# Patient Record
Sex: Female | Born: 1980 | Hispanic: Yes | Marital: Married | Smoking: Never smoker
Health system: Southern US, Community
[De-identification: ages and names within clinical notes are randomized; demographics above are authoritative.]

## PROBLEM LIST (undated history)

## (undated) DIAGNOSIS — Z789 Other specified health status: Secondary | ICD-10-CM

## (undated) HISTORY — PX: NO PAST SURGERIES: SHX2092

---

## 2002-11-19 ENCOUNTER — Emergency Department (HOSPITAL_COMMUNITY): Admission: EM | Admit: 2002-11-19 | Discharge: 2002-11-19 | Payer: Self-pay

## 2003-03-20 ENCOUNTER — Ambulatory Visit (HOSPITAL_COMMUNITY): Admission: RE | Admit: 2003-03-20 | Discharge: 2003-03-20 | Payer: Self-pay | Admitting: Obstetrics

## 2003-06-24 ENCOUNTER — Encounter: Admission: RE | Admit: 2003-06-24 | Discharge: 2003-06-24 | Payer: Self-pay | Admitting: Infectious Diseases

## 2003-06-27 ENCOUNTER — Encounter: Admission: RE | Admit: 2003-06-27 | Discharge: 2003-06-27 | Payer: Self-pay | Admitting: Infectious Diseases

## 2003-07-10 ENCOUNTER — Inpatient Hospital Stay (HOSPITAL_COMMUNITY): Admission: AD | Admit: 2003-07-10 | Discharge: 2003-07-12 | Payer: Self-pay | Admitting: Obstetrics

## 2006-08-17 ENCOUNTER — Ambulatory Visit (HOSPITAL_COMMUNITY): Admission: RE | Admit: 2006-08-17 | Discharge: 2006-08-17 | Payer: Self-pay | Admitting: Obstetrics & Gynecology

## 2006-10-31 ENCOUNTER — Ambulatory Visit (HOSPITAL_COMMUNITY): Admission: RE | Admit: 2006-10-31 | Discharge: 2006-10-31 | Payer: Self-pay | Admitting: Obstetrics & Gynecology

## 2006-10-31 IMAGING — US US OB COMP +14 WK
1 series · 14 of 28 positions shown · non-contrast
Comparison: none

OBSTETRICAL ULTRASOUND:

 This ultrasound exam was performed in the [HOSPITAL] Ultrasound Department.  The OB US report was generated in the AS system, and faxed to the ordering physician.  This report is also available in [REDACTED] PACS.

[Series 1: us ob comp +14 wk · 0.30mm/px · 14 of 74 slices shown]
[im 3/74]
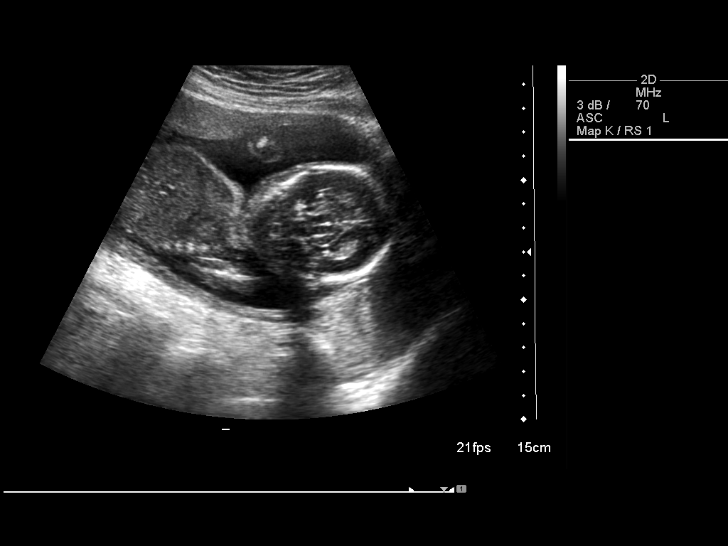
[im 9/74]
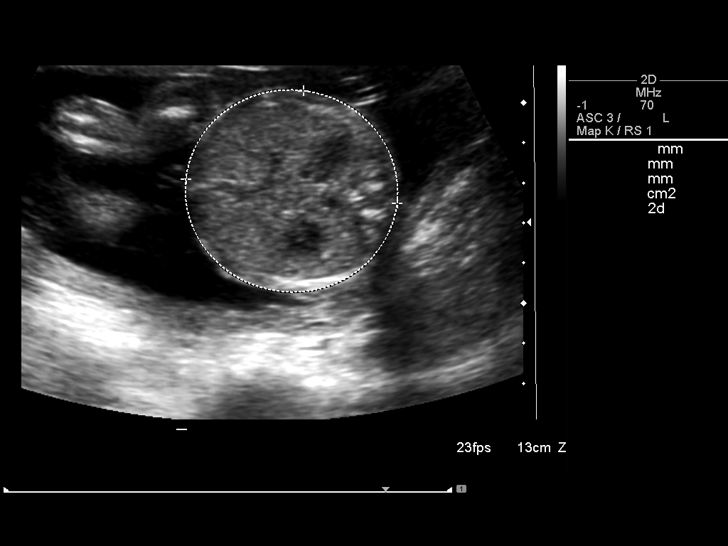
[im 14/74]
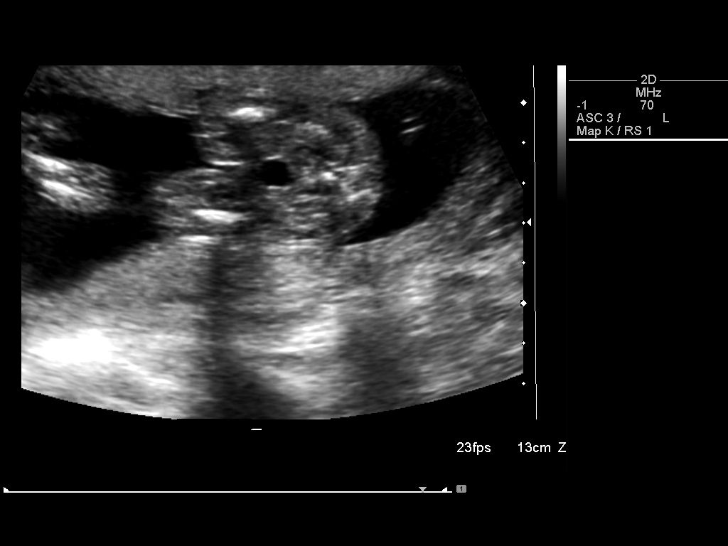
[im 19/74]
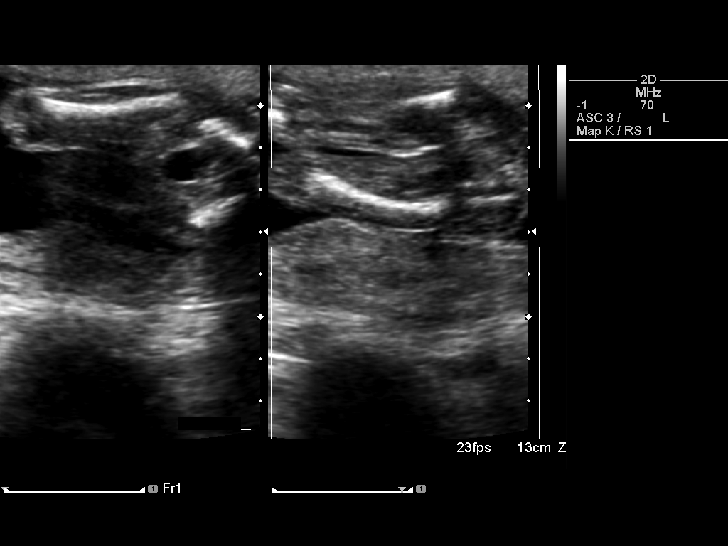
[im 25/74]
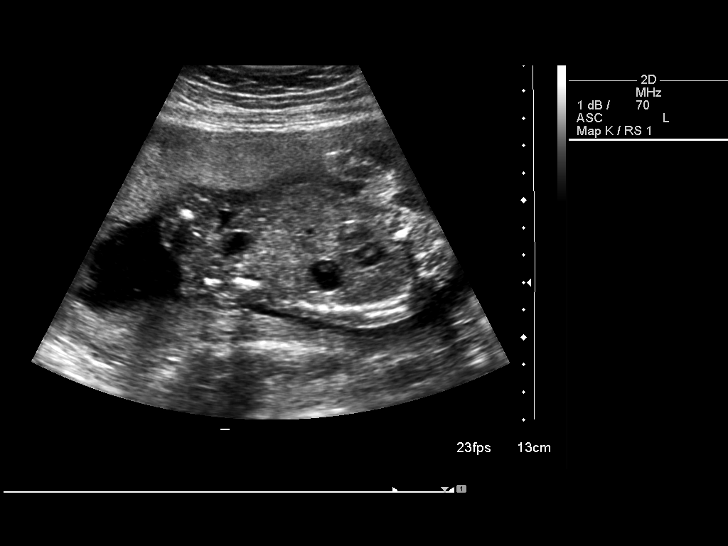
[im 30/74]
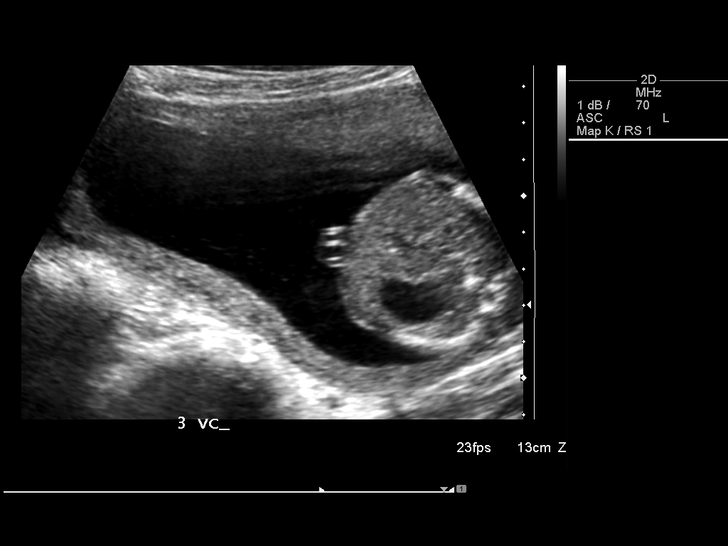
[im 36/74]
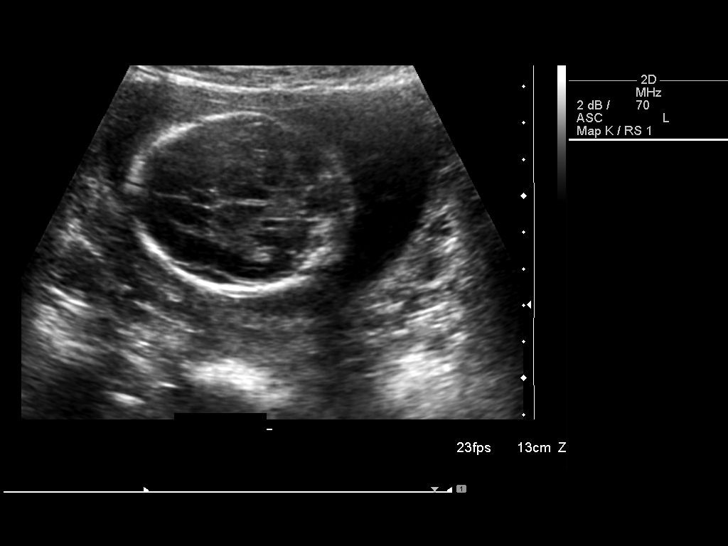
[im 41/74]
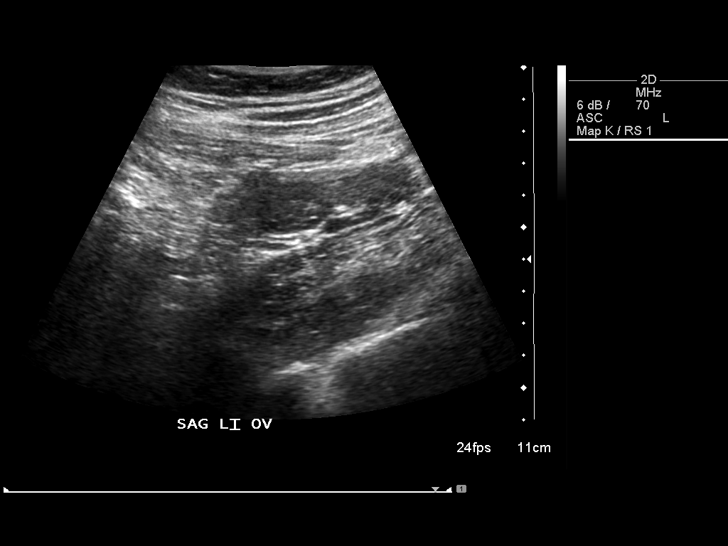
[im 46/74]
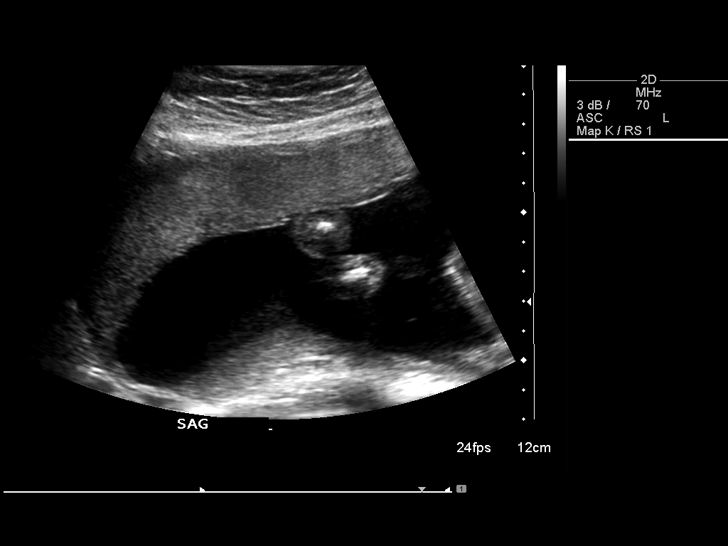
[im 52/74]
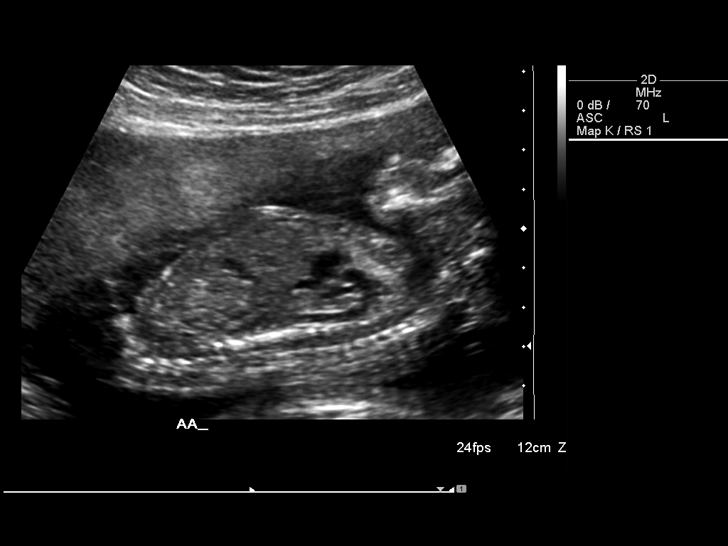
[im 57/74]
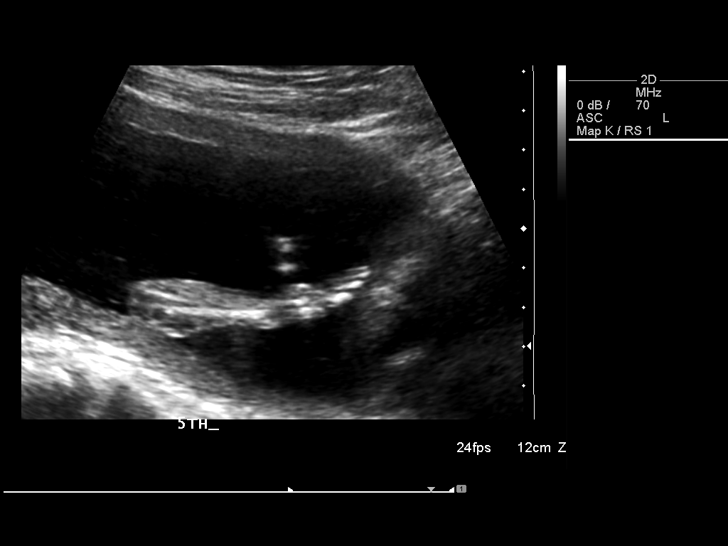
[im 63/74]
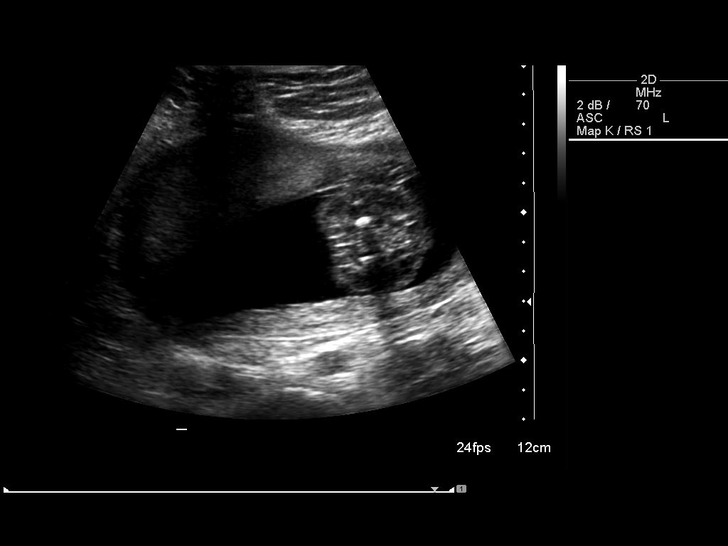
[im 68/74]
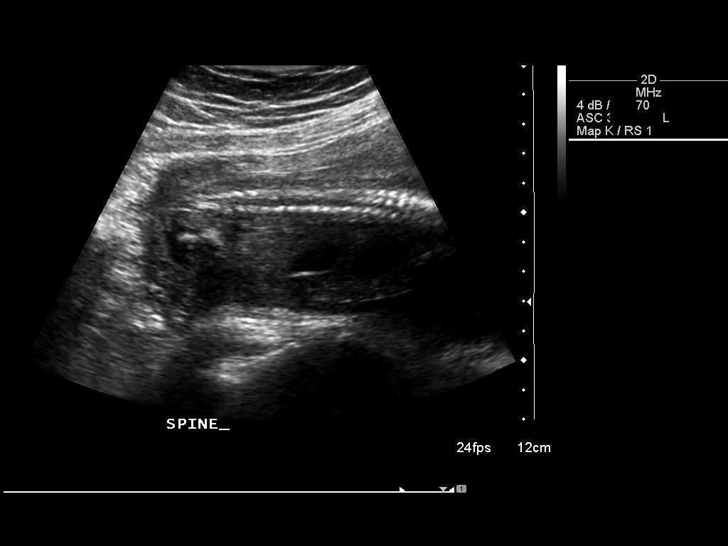
[im 74/74]
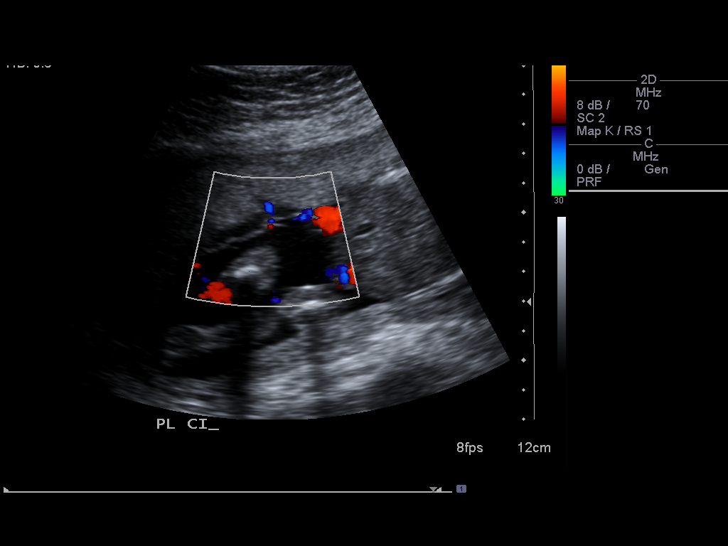

[14 of 28 positions shown; findings below may reference images not displayed]

IMPRESSION: See AS Obstetric US report.

## 2007-03-10 ENCOUNTER — Inpatient Hospital Stay (HOSPITAL_COMMUNITY): Admission: AD | Admit: 2007-03-10 | Discharge: 2007-03-13 | Payer: Self-pay | Admitting: Obstetrics

## 2010-02-28 ENCOUNTER — Inpatient Hospital Stay (HOSPITAL_COMMUNITY)
Admission: AD | Admit: 2010-02-28 | Discharge: 2010-02-28 | Disposition: A | Discharge status: Home / Self Care | Payer: Self-pay | Source: Ambulatory Visit | Attending: Obstetrics | Admitting: Obstetrics

## 2010-02-28 ENCOUNTER — Inpatient Hospital Stay (HOSPITAL_COMMUNITY): Payer: Self-pay

## 2010-02-28 DIAGNOSIS — O021 Missed abortion: Secondary | ICD-10-CM

## 2010-10-02 LAB — CBC
HCT: 39.3
Hemoglobin: 13.6
MCHC: 34.5
MCV: 94
Platelets: 172
RBC: 4.18
RDW: 14.8
WBC: 9.3

## 2010-10-02 LAB — RPR: RPR Ser Ql: NONREACTIVE

## 2010-10-05 LAB — CBC
HCT: 34.5 — ABNORMAL LOW
Hemoglobin: 12.1
MCHC: 34.9
MCV: 92.9
Platelets: 136 — ABNORMAL LOW
RBC: 3.72 — ABNORMAL LOW
RDW: 14.8
WBC: 8.8

## 2011-01-12 NOTE — L&D Delivery Note (Signed)
Delivery Note At 12:36 PM a viable female was delivered via Vaginal, Spontaneous Delivery (Presentation: ;  ).  APGAR: 9, 9; weight .   Placenta status: , .  Cord:  with the following complications: None.  Cord pH: not done  Anesthesia: None  Episiotomy:  Lacerations:  Suture Repair: 2.0 Est. Blood Loss (mL):   Mom to postpartum.  Baby to nursery-stable.  Bonnie King A 10/02/2011, 12:50 PM

## 2011-04-23 LAB — OB RESULTS CONSOLE HEPATITIS B SURFACE ANTIGEN: Hepatitis B Surface Ag: NEGATIVE

## 2011-04-23 LAB — OB RESULTS CONSOLE ABO/RH: RH Type: POSITIVE

## 2011-04-23 LAB — OB RESULTS CONSOLE RPR: RPR: NONREACTIVE

## 2011-08-31 LAB — OB RESULTS CONSOLE GBS: GBS: NEGATIVE

## 2011-10-02 ENCOUNTER — Encounter (HOSPITAL_COMMUNITY): Payer: Self-pay | Admitting: *Deleted

## 2011-10-02 ENCOUNTER — Inpatient Hospital Stay (HOSPITAL_COMMUNITY)
Admission: AD | Admit: 2011-10-02 | Discharge: 2011-10-02 | Disposition: A | Discharge status: Home / Self Care | Payer: Medicaid Other | Source: Ambulatory Visit | Attending: Obstetrics | Admitting: Obstetrics

## 2011-10-02 ENCOUNTER — Encounter (HOSPITAL_COMMUNITY): Payer: Self-pay

## 2011-10-02 ENCOUNTER — Inpatient Hospital Stay (HOSPITAL_COMMUNITY)
Admission: AD | Admit: 2011-10-02 | Discharge: 2011-10-04 | DRG: 775 | Disposition: A | Discharge status: Home / Self Care | Payer: Medicaid Other | Source: Ambulatory Visit | Attending: Obstetrics | Admitting: Obstetrics

## 2011-10-02 DIAGNOSIS — O479 False labor, unspecified: Secondary | ICD-10-CM | POA: Insufficient documentation

## 2011-10-02 LAB — CBC
Hemoglobin: 13.5 g/dL (ref 12.0–15.0)
MCH: 30.8 pg (ref 26.0–34.0)
MCHC: 33.8 g/dL (ref 30.0–36.0)

## 2011-10-02 LAB — RPR: RPR Ser Ql: NONREACTIVE

## 2011-10-02 MED ORDER — DIPHENHYDRAMINE HCL 25 MG PO CAPS: 25.0000 mg | ORAL_CAPSULE | Freq: Four times a day (QID) | ORAL | Status: DC | PRN

## 2011-10-02 MED ORDER — LANOLIN HYDROUS EX OINT: TOPICAL_OINTMENT | CUTANEOUS | Status: DC | PRN

## 2011-10-02 MED ORDER — SIMETHICONE 80 MG PO CHEW: 80.0000 mg | CHEWABLE_TABLET | ORAL | Status: DC | PRN

## 2011-10-02 MED ORDER — ZOLPIDEM TARTRATE 5 MG PO TABS: 5.0000 mg | ORAL_TABLET | Freq: Every evening | ORAL | Status: DC | PRN

## 2011-10-02 MED ORDER — ONDANSETRON HCL 4 MG PO TABS: 4.0000 mg | ORAL_TABLET | ORAL | Status: DC | PRN

## 2011-10-02 MED ORDER — TETANUS-DIPHTH-ACELL PERTUSSIS 5-2.5-18.5 LF-MCG/0.5 IM SUSP
0.5000 mL | Freq: Once | INTRAMUSCULAR | Status: AC
  Administered 2011-10-02: 0.5 mL via INTRAMUSCULAR
  Filled 2011-10-02: qty 0.5

## 2011-10-02 MED ORDER — ONDANSETRON HCL 4 MG/2ML IJ SOLN: 4.0000 mg | Freq: Four times a day (QID) | INTRAMUSCULAR | Status: DC | PRN

## 2011-10-02 MED ORDER — BUTORPHANOL TARTRATE 1 MG/ML IJ SOLN
1.0000 mg | INTRAMUSCULAR | Status: DC | PRN
  Administered 2011-10-02: 1 mg via INTRAVENOUS

## 2011-10-02 MED ORDER — OXYTOCIN BOLUS FROM INFUSION
500.0000 mL | Freq: Once | INTRAVENOUS | Status: DC
  Filled 2011-10-02: qty 500

## 2011-10-02 MED ORDER — LACTATED RINGERS IV SOLN: 500.0000 mL | INTRAVENOUS | Status: DC | PRN

## 2011-10-02 MED ORDER — BUTORPHANOL TARTRATE 1 MG/ML IJ SOLN
INTRAMUSCULAR | Status: AC
  Filled 2011-10-02: qty 1

## 2011-10-02 MED ORDER — ACETAMINOPHEN 325 MG PO TABS: 650.0000 mg | ORAL_TABLET | ORAL | Status: DC | PRN

## 2011-10-02 MED ORDER — DIBUCAINE 1 % RE OINT: 1.0000 "application " | TOPICAL_OINTMENT | RECTAL | Status: DC | PRN

## 2011-10-02 MED ORDER — TETANUS-DIPHTH-ACELL PERTUSSIS 5-2.5-18.5 LF-MCG/0.5 IM SUSP: 0.5000 mL | Freq: Once | INTRAMUSCULAR | Status: DC

## 2011-10-02 MED ORDER — OXYCODONE-ACETAMINOPHEN 5-325 MG PO TABS
1.0000 | ORAL_TABLET | ORAL | Status: DC | PRN
  Filled 2011-10-02: qty 2

## 2011-10-02 MED ORDER — CITRIC ACID-SODIUM CITRATE 334-500 MG/5ML PO SOLN: 30.0000 mL | ORAL | Status: DC | PRN

## 2011-10-02 MED ORDER — LACTATED RINGERS IV SOLN
INTRAVENOUS | Status: DC
  Administered 2011-10-02: 1000 mL via INTRAVENOUS

## 2011-10-02 MED ORDER — IBUPROFEN 600 MG PO TABS
600.0000 mg | ORAL_TABLET | Freq: Four times a day (QID) | ORAL | Status: DC
  Administered 2011-10-02 – 2011-10-04 (×8): 600 mg via ORAL
  Filled 2011-10-02 (×5): qty 1

## 2011-10-02 MED ORDER — IBUPROFEN 600 MG PO TABS
600.0000 mg | ORAL_TABLET | Freq: Four times a day (QID) | ORAL | Status: DC | PRN
  Filled 2011-10-02 (×3): qty 1

## 2011-10-02 MED ORDER — OXYCODONE-ACETAMINOPHEN 5-325 MG PO TABS
1.0000 | ORAL_TABLET | ORAL | Status: DC | PRN
  Administered 2011-10-02 – 2011-10-03 (×2): 1 via ORAL
  Administered 2011-10-04 (×2): 2 via ORAL
  Filled 2011-10-02: qty 2
  Filled 2011-10-02 (×2): qty 1

## 2011-10-02 MED ORDER — BENZOCAINE-MENTHOL 20-0.5 % EX AERO: 1.0000 "application " | INHALATION_SPRAY | CUTANEOUS | Status: DC | PRN

## 2011-10-02 MED ORDER — LIDOCAINE HCL (PF) 1 % IJ SOLN
30.0000 mL | INTRAMUSCULAR | Status: DC | PRN
  Filled 2011-10-02: qty 30

## 2011-10-02 MED ORDER — ONDANSETRON HCL 4 MG/2ML IJ SOLN: 4.0000 mg | INTRAMUSCULAR | Status: DC | PRN

## 2011-10-02 MED ORDER — WITCH HAZEL-GLYCERIN EX PADS: 1.0000 "application " | MEDICATED_PAD | CUTANEOUS | Status: DC | PRN

## 2011-10-02 MED ORDER — FERROUS SULFATE 325 (65 FE) MG PO TABS
325.0000 mg | ORAL_TABLET | Freq: Two times a day (BID) | ORAL | Status: DC
  Administered 2011-10-02 – 2011-10-04 (×4): 325 mg via ORAL
  Filled 2011-10-02 (×4): qty 1

## 2011-10-02 MED ORDER — OXYTOCIN 40 UNITS IN LACTATED RINGERS INFUSION - SIMPLE MED
62.5000 mL/h | Freq: Once | INTRAVENOUS | Status: AC
  Administered 2011-10-02: 62.5 mL/h via INTRAVENOUS
  Filled 2011-10-02: qty 1000

## 2011-10-02 MED ORDER — SENNOSIDES-DOCUSATE SODIUM 8.6-50 MG PO TABS
2.0000 | ORAL_TABLET | Freq: Every day | ORAL | Status: DC
  Administered 2011-10-02 – 2011-10-03 (×2): 2 via ORAL

## 2011-10-02 MED ORDER — PRENATAL MULTIVITAMIN CH
1.0000 | ORAL_TABLET | Freq: Every day | ORAL | Status: DC
  Administered 2011-10-03 – 2011-10-04 (×2): 1 via ORAL
  Filled 2011-10-02 (×2): qty 1

## 2011-10-02 MED ORDER — INFLUENZA VIRUS VACC SPLIT PF IM SUSP
0.5000 mL | INTRAMUSCULAR | Status: AC
  Administered 2011-10-03: 0.5 mL via INTRAMUSCULAR
  Filled 2011-10-02: qty 0.5

## 2011-10-02 NOTE — MAU Note (Signed)
Patient is in for labor eval. She denies any vaginal bleeding or lof. Ctx q100m fpr 4 hours. Reports good fetal movement.

## 2011-10-02 NOTE — MAU Note (Signed)
Pt reports having ctx q3 min more painful. Started yesterday. reports bloody show and good fetal movement.

## 2011-10-02 NOTE — Progress Notes (Signed)
Dr Gaynell Face notified of patient, tracing, ctx pattern, sve result. Order to discharge home.

## 2011-10-02 NOTE — H&P (Signed)
This is Dr. Francoise Ceo dictating the history and physical on  Bonnie King she's a 31 year old gravida 4 para 2012 at 8 weeks and and some days her EDC is 10/04/2011 admitted in labor 7 cm 100% vertex 0 station she is now 9 cm 100% vertex minus at 0 station amniotomy performed the fluid obtained no history of fluid leakage the GBS is negative Past medical history negative Past surgical history negative Social history negative System review negative Physical exam well-developed female in labor HEENT negative Breasts negative Heart regular rhythm no murmurs no gallops Lungs clear to P&A Abdomen term Pelvic as described above Extremities negative

## 2011-10-03 LAB — CBC
HCT: 36.7 % (ref 36.0–46.0)
Hemoglobin: 12.1 g/dL (ref 12.0–15.0)
RBC: 3.97 MIL/uL (ref 3.87–5.11)

## 2011-10-03 LAB — ABO/RH: ABO/RH(D): A POS

## 2011-10-03 NOTE — Progress Notes (Signed)
Patient ID: Bonnie King, female   DOB: 1980-03-14, 31 y.o.   MRN: [id number] Postpartum no daily 1 Vital signs normal Fundus firm Lochia moderate Legs negative no well

## 2011-10-04 NOTE — Progress Notes (Signed)
UR chart review completed.  

## 2011-10-04 NOTE — Discharge Summary (Signed)
Obstetric Discharge Summary Reason for Admission: induction of labor Prenatal Procedures: none Intrapartum Procedures: spontaneous vaginal delivery Postpartum Procedures: none Complications-Operative and Postpartum: none Hemoglobin  Date Value Range Status  10/03/2011 12.1  12.0 - 15.0 g/dL Final     HCT  Date Value Range Status  10/03/2011 36.7  36.0 - 46.0 % Final    Physical Exam:  General: alert Lochia: appropriate Uterine Fundus: firm Incision: healing well DVT Evaluation: No evidence of DVT seen on physical exam.  Discharge Diagnoses: Term Pregnancy-delivered  Discharge Information: Date: 10/04/2011 Activity: pelvic rest Diet: routine Medications: Percocet Condition: stable Instructions: refer to practice specific booklet Discharge to: home Follow-up Information    Call in 6 weeks to follow up.         Newborn Data: Live born female  Birth Weight: 8 lb 1.3 oz (3665 g) APGAR: 9, 9  Home with mother.  Kenechukwu Eckstein A 10/04/2011, 5:55 AM

## 2013-11-12 ENCOUNTER — Encounter (HOSPITAL_COMMUNITY): Payer: Self-pay | Admitting: *Deleted

## 2015-12-16 ENCOUNTER — Ambulatory Visit (INDEPENDENT_AMBULATORY_CARE_PROVIDER_SITE_OTHER): Payer: Self-pay | Admitting: Certified Nurse Midwife

## 2015-12-16 ENCOUNTER — Encounter: Payer: Self-pay | Admitting: Certified Nurse Midwife

## 2015-12-16 VITALS — BP 112/62 | HR 78 | Wt 162.0 lb

## 2015-12-16 DIAGNOSIS — Z201 Contact with and (suspected) exposure to tuberculosis: Secondary | ICD-10-CM

## 2015-12-16 DIAGNOSIS — Z1151 Encounter for screening for human papillomavirus (HPV): Secondary | ICD-10-CM

## 2015-12-16 DIAGNOSIS — Z3490 Encounter for supervision of normal pregnancy, unspecified, unspecified trimester: Secondary | ICD-10-CM

## 2015-12-16 DIAGNOSIS — O0990 Supervision of high risk pregnancy, unspecified, unspecified trimester: Secondary | ICD-10-CM | POA: Insufficient documentation

## 2015-12-16 DIAGNOSIS — Z124 Encounter for screening for malignant neoplasm of cervix: Secondary | ICD-10-CM

## 2015-12-16 DIAGNOSIS — Z3492 Encounter for supervision of normal pregnancy, unspecified, second trimester: Secondary | ICD-10-CM

## 2015-12-16 LAB — POCT URINALYSIS DIPSTICK
BILIRUBIN UA: NEGATIVE
GLUCOSE UA: NEGATIVE
Ketones, UA: NEGATIVE
NITRITE UA: NEGATIVE
PH UA: 7
RBC UA: NEGATIVE
SPEC GRAV UA: 1.015
Urobilinogen, UA: NEGATIVE

## 2015-12-16 NOTE — Addendum Note (Signed)
Addended by: Marya LandryFOSTER, Arcelia Pals D on: 12/16/2015 05:01 PM   Modules accepted: Orders

## 2015-12-16 NOTE — Progress Notes (Signed)
Subjective:    Bonnie King is being seen today for her first obstetrical visit.  This is not a planned pregnancy. She is at 683w1d gestation. 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  Marland Kitchen. Relationship with FOB: spouse, living together. Patient does intend to breast feed. Pregnancy history fully reviewed.  The information documented in the HPI was reviewed and verified.  Menstrual History: OB History    Gravida Para Term Preterm AB Living   5 3 3   1 3    SAB TAB Ectopic Multiple Live Births   1       3       Patient's last menstrual period was 09/01/2015.    History reviewed. No pertinent past medical history.  History reviewed. No pertinent surgical history.   (Not in a hospital admission) No Known Allergies  Social History  Substance Use Topics  . Smoking status: Never Smoker  . Smokeless tobacco: Never Used  . Alcohol use No    Family History  Problem Relation Age of Onset  . Diabetes Mother   . Heart disease Mother   . Cancer Father      Review of Systems Constitutional: negative for weight loss Gastrointestinal: negative for vomiting Genitourinary:negative for genital lesions and vaginal discharge and dysuria Musculoskeletal:negative for back pain Behavioral/Psych: negative for abusive relationship, depression, illegal drug usage and tobacco use    Objective:    BP 112/62   Pulse 78   Wt 162 lb (73.5 kg)   LMP 09/01/2015   BMI 29.63 kg/m  General Appearance:    Alert, cooperative, no distress, appears stated age  Head:    Normocephalic, without obvious abnormality, atraumatic  Eyes:    PERRL, conjunctiva/corneas clear, EOM's intact, fundi    benign, both eyes  Ears:    Normal TM's and external ear canals, both ears  Nose:   Nares normal, septum midline, mucosa normal, no drainage    or sinus tenderness  Throat:   Lips, mucosa, and tongue normal; teeth and gums normal  Neck:   Supple, symmetrical,  trachea midline, no adenopathy;    thyroid:  no enlargement/tenderness/nodules; no carotid   bruit or JVD  Back:     Symmetric, no curvature, ROM normal, no CVA tenderness  Lungs:     Clear to auscultation bilaterally, respirations unlabored  Chest Wall:    No tenderness or deformity   Heart:    Regular rate and rhythm, S1 and S2 normal, no murmur, rub   or gallop  Breast Exam:    No tenderness, masses, or nipple abnormality  Abdomen:     Soft, non-tender, bowel sounds active all four quadrants,    no masses, no organomegaly  Genitalia:    Normal female without lesion, discharge or tenderness  Extremities:   Extremities normal, atraumatic, no cyanosis or edema  Pulses:   2+ and symmetric all extremities  Skin:   Skin color, texture, turgor normal, no rashes or lesions  Lymph nodes:   Cervical, supraclavicular, and axillary nodes normal  Neurologic:   CNII-XII intact, normal strength, sensation and reflexes    throughout      Cervix:  Long, thick, closed and posterior.  FHR; 148 By doppler.  FH: less than U.     Lab Review Urine pregnancy test Labs reviewed yes Radiologic studies reviewed no Assessment:    Pregnancy at 663w1d weeks   Late to prenatal care  Recent exposure to TB: aunt  Plan:     Prenatal vitamins OTC.  Counseling provided regarding continued use of seat belts, cessation of alcohol consumption, smoking or use of illicit drugs; infection precautions i.e., influenza/TDAP immunizations, toxoplasmosis,CMV, parvovirus, listeria and varicella; workplace safety, exercise during pregnancy; routine dental care, safe medications, sexual activity, hot tubs, saunas, pools, travel, caffeine use, fish and methlymercury, potential toxins, hair treatments, varicose veins Weight gain recommendations per IOM guidelines reviewed: underweight/BMI< 18.5--> gain 28 - 40 lbs; normal weight/BMI 18.5 - 24.9--> gain 25 - 35 lbs; overweight/BMI 25 - 29.9--> gain 15 - 25 lbs; obese/BMI >30->gain   11 - 20 lbs Problem list reviewed and updated. FIRST/CF mutation testing/NIPT/QUAD SCREEN/fragile X/Ashkenazi Jewish population testing/Spinal muscular atrophy discussed: declined. Role of ultrasound in pregnancy discussed; fetal survey: requested. Amniocentesis discussed: not indicated. VBAC calculator score: VBAC consent form provided Meds ordered this encounter  Medications  . Prenatal Vit-Fe Fumarate-FA (MULTIVITAMIN-PRENATAL) 27-0.8 MG TABS tablet    Sig: Take 1 tablet by mouth daily at 12 noon.   Orders Placed This Encounter  Procedures  . Culture, OB Urine  . US MFM OB COMP + 14 WK    Standing Status:   Future    Standing Expiration Date:   02/15/2017    Order Specific Question:   Reason for Exam (SYMPTOM  OR DIAGNOSIS REQUIRED)    Answer:   fetal anatomy scan    Order Specific Question:   Preferred imaging location?    Answer:   [facility]  . Hemoglobinopathy evaluation  . Varicella zoster antibody, IgG  . VITAMIN D 25 Hydroxy (Vit-D Deficiency, Fractures)  . Cystic Fibrosis Mutation 97  . Obstetric Panel, Including HIV  . ToxASSURE Select 13 (MW), Urine  . MaterniT21 PLUS Core+SCA    Order Specific Question:   Is the patient insulin dependent?    Answer:   No    Order Specific Question:   Please enter gestational age. This should be expressed as weeks AND days, i.e. 16w 6d. Enter weeks here. Enter days in next question.    Answer:   5715    Order Specific Question:   Please enter gestational age. This should be expressed as weeks AND days, i.e. 16w 6d. Enter days here. Enter weeks in previous question.    Answer:   1    Order Specific Question:   How was gestational age calculated?    Answer:   LMP    Order Specific Question:   Please give the date of LMP OR Ultrasound OR Estimated date of delivery.    Answer:   06/07/2016    Order Specific Question:   Number of Fetuses (Type of Pregnancy):    Answer:   1    Order Specific Question:   Indications for performing the  test? (please choose all that apply):    Answer:   Routine screening    Order Specific Question:   Other Indications? (Y=Yes, N=No)    Answer:   N    Order Specific Question:   If this is a repeat specimen, please indicate the reason:    Answer:   Not indicated    Order Specific Question:   Please specify the patient's race: (C=White/Caucasion, B=Black, I=Native American, A=Asian, H=Hispanic, O=Other, U=Unknown)    Answer:   H    Order Specific Question:   Donor Egg - indicate if the egg was obtained from in vitro fertilization.    Answer:   N    Order Specific Question:   Age of  Egg Donor.    Answer:   43    Order Specific Question:   Prior Down Syndrome/ONTD screening during current pregnancy.    Answer:   N    Order Specific Question:   Prior First Trimester Testing    Answer:   N    Order Specific Question:   Prior Second Trimester Testing    Answer:   N    Order Specific Question:   Family History of Neural Tube Defects    Answer:   N    Order Specific Question:   Prior Pregnancy with Down Syndrome    Answer:   N    Order Specific Question:   Please give the patient's weight (in pounds)    Answer:   162  . Hemoglobin A1c  . Quantiferon tb gold assay (blood)  . POCT urinalysis dipstick    Follow up in 4 weeks. 50% of 30 min visit spent on counseling and coordination of care.

## 2015-12-17 ENCOUNTER — Encounter: Payer: Self-pay | Admitting: Certified Nurse Midwife

## 2015-12-18 LAB — CYTOLOGY - PAP
DIAGNOSIS: NEGATIVE
HPV: NOT DETECTED

## 2015-12-19 LAB — QUANTIFERON IN TUBE
QFT TB AG MINUS NIL VALUE: 0.01 [IU]/mL
QUANTIFERON MITOGEN VALUE: 7.98 IU/mL
QUANTIFERON TB AG VALUE: 0.08 IU/mL
QUANTIFERON TB GOLD: NEGATIVE
Quantiferon Nil Value: 0.07 IU/mL

## 2015-12-19 LAB — QUANTIFERON TB GOLD ASSAY (BLOOD)

## 2015-12-20 LAB — URINE CULTURE, OB REFLEX

## 2015-12-20 LAB — CULTURE, OB URINE

## 2015-12-22 LAB — NUSWAB VG+, CANDIDA 6SP
CANDIDA ALBICANS, NAA: NEGATIVE
CANDIDA GLABRATA, NAA: NEGATIVE
Candida krusei, NAA: NEGATIVE
Candida lusitaniae, NAA: NEGATIVE
Candida parapsilosis, NAA: NEGATIVE
Candida tropicalis, NAA: NEGATIVE
Chlamydia trachomatis, NAA: NEGATIVE
NEISSERIA GONORRHOEAE, NAA: NEGATIVE
Trich vag by NAA: NEGATIVE

## 2015-12-23 LAB — OBSTETRIC PANEL, INCLUDING HIV
Antibody Screen: NEGATIVE
BASOS ABS: 0 10*3/uL (ref 0.0–0.2)
Basos: 0 %
EOS (ABSOLUTE): 0.3 10*3/uL (ref 0.0–0.4)
Eos: 4 %
HIV SCREEN 4TH GENERATION: NONREACTIVE
Hematocrit: 37.2 % (ref 34.0–46.6)
Hemoglobin: 12.7 g/dL (ref 11.1–15.9)
Hepatitis B Surface Ag: NEGATIVE
IMMATURE GRANS (ABS): 0 10*3/uL (ref 0.0–0.1)
Immature Granulocytes: 0 %
LYMPHS: 23 %
Lymphocytes Absolute: 1.7 10*3/uL (ref 0.7–3.1)
MCH: 30.9 pg (ref 26.6–33.0)
MCHC: 34.1 g/dL (ref 31.5–35.7)
MCV: 91 fL (ref 79–97)
MONOCYTES: 7 %
Monocytes Absolute: 0.5 10*3/uL (ref 0.1–0.9)
NEUTROS PCT: 66 %
Neutrophils Absolute: 4.7 10*3/uL (ref 1.4–7.0)
PLATELETS: 213 10*3/uL (ref 150–379)
RBC: 4.11 x10E6/uL (ref 3.77–5.28)
RDW: 14.1 % (ref 12.3–15.4)
RPR Ser Ql: NONREACTIVE
RUBELLA: 1.16 {index} (ref >0.99)
Rh Factor: POSITIVE
WBC: 7.1 10*3/uL (ref 3.4–10.8)

## 2015-12-23 LAB — HEMOGLOBINOPATHY EVALUATION
HEMOGLOBIN F QUANTITATION: 0 % (ref 0.0–2.0)
HGB A: 97.2 % (ref 94.0–98.0)
HGB C: 0 %
HGB S: 0 %
Hemoglobin A2 Quantitation: 2.8 % (ref 0.7–3.1)

## 2015-12-23 LAB — HEMOGLOBIN A1C
Est. average glucose Bld gHb Est-mCnc: 97 mg/dL
HEMOGLOBIN A1C: 5 % (ref 4.8–5.6)

## 2015-12-23 LAB — TOXASSURE SELECT 13 (MW), URINE

## 2015-12-23 LAB — VARICELLA ZOSTER ANTIBODY, IGG: Varicella zoster IgG: <135 index — ABNORMAL LOW (ref >165)

## 2015-12-23 LAB — CYSTIC FIBROSIS MUTATION 97: GENE DIS ANAL CARRIER INTERP BLD/T-IMP: NOT DETECTED

## 2015-12-23 LAB — VITAMIN D 25 HYDROXY (VIT D DEFICIENCY, FRACTURES): VIT D 25 HYDROXY: 25.4 ng/mL — AB (ref 30.0–100.0)

## 2015-12-25 ENCOUNTER — Other Ambulatory Visit: Payer: Self-pay | Admitting: Certified Nurse Midwife

## 2015-12-25 DIAGNOSIS — Z2839 Other underimmunization status: Secondary | ICD-10-CM | POA: Insufficient documentation

## 2015-12-25 DIAGNOSIS — Z283 Underimmunization status: Secondary | ICD-10-CM

## 2015-12-25 DIAGNOSIS — O09899 Supervision of other high risk pregnancies, unspecified trimester: Secondary | ICD-10-CM

## 2015-12-25 DIAGNOSIS — R7989 Other specified abnormal findings of blood chemistry: Secondary | ICD-10-CM

## 2015-12-25 MED ORDER — VITAMIN D (ERGOCALCIFEROL) 1.25 MG (50000 UNIT) PO CAPS: 50000.0000 [IU] | ORAL_CAPSULE | ORAL | 2 refills | Status: DC

## 2016-01-02 ENCOUNTER — Encounter: Payer: Self-pay | Admitting: *Deleted

## 2016-01-13 ENCOUNTER — Encounter: Payer: Self-pay | Admitting: Obstetrics

## 2016-01-13 ENCOUNTER — Ambulatory Visit (INDEPENDENT_AMBULATORY_CARE_PROVIDER_SITE_OTHER): Payer: Self-pay | Admitting: Obstetrics

## 2016-01-13 VITALS — BP 122/73 | HR 97 | Wt 165.6 lb

## 2016-01-13 DIAGNOSIS — Z3490 Encounter for supervision of normal pregnancy, unspecified, unspecified trimester: Secondary | ICD-10-CM

## 2016-01-13 DIAGNOSIS — Z3480 Encounter for supervision of other normal pregnancy, unspecified trimester: Secondary | ICD-10-CM

## 2016-01-13 DIAGNOSIS — R7989 Other specified abnormal findings of blood chemistry: Secondary | ICD-10-CM

## 2016-01-13 DIAGNOSIS — O09899 Supervision of other high risk pregnancies, unspecified trimester: Secondary | ICD-10-CM

## 2016-01-13 DIAGNOSIS — Z3492 Encounter for supervision of normal pregnancy, unspecified, second trimester: Secondary | ICD-10-CM

## 2016-01-13 DIAGNOSIS — Z283 Underimmunization status: Secondary | ICD-10-CM

## 2016-01-13 NOTE — Progress Notes (Signed)
Patient is doing well today.  

## 2016-01-13 NOTE — Progress Notes (Signed)
Patient seen by Dr. Clearance CootsHarper.

## 2016-01-13 NOTE — Progress Notes (Signed)
Subjective:    Bonnie DamMaria D King is a 36 y.o. female being seen today for her obstetrical visit. She is at 211w1d gestation. Patient reports: no complaints . Fetal movement: normal.  Problem List Items Addressed This Visit    Low vitamin D level   Maternal varicella, non-immune   Supervision of normal pregnancy, antepartum - Primary     Patient Active Problem List   Diagnosis Date Noted  . Maternal varicella, non-immune 12/25/2015  . Low vitamin D level 12/25/2015  . Supervision of normal pregnancy, antepartum 12/16/2015   Objective:    BP 122/73   Pulse 97   Wt 165 lb 9.6 oz (75.1 kg)   LMP 09/01/2015   BMI 30.29 kg/m  FHT: 150 BPM  Uterine Size: size equals dates     Assessment:    Pregnancy @ [redacted]w[redacted]d    Plan:    Signs and symptoms of preterm labor: discussed.  Labs, problem list reviewed and updated 2 hr GTT planned Follow up in 4 weeks.

## 2016-01-27 ENCOUNTER — Other Ambulatory Visit: Payer: Self-pay | Admitting: Certified Nurse Midwife

## 2016-01-27 DIAGNOSIS — Z2839 Other underimmunization status: Secondary | ICD-10-CM

## 2016-01-27 DIAGNOSIS — O4100X0 Oligohydramnios, unspecified trimester, not applicable or unspecified: Secondary | ICD-10-CM | POA: Insufficient documentation

## 2016-01-27 DIAGNOSIS — R7989 Other specified abnormal findings of blood chemistry: Secondary | ICD-10-CM

## 2016-01-27 DIAGNOSIS — O4102X0 Oligohydramnios, second trimester, not applicable or unspecified: Secondary | ICD-10-CM

## 2016-01-27 DIAGNOSIS — Z283 Underimmunization status: Secondary | ICD-10-CM

## 2016-01-27 DIAGNOSIS — O09899 Supervision of other high risk pregnancies, unspecified trimester: Secondary | ICD-10-CM

## 2016-01-27 DIAGNOSIS — Z3480 Encounter for supervision of other normal pregnancy, unspecified trimester: Secondary | ICD-10-CM

## 2016-01-28 ENCOUNTER — Ambulatory Visit (HOSPITAL_COMMUNITY)
Admission: RE | Admit: 2016-01-28 | Discharge: 2016-01-28 | Disposition: A | Discharge status: Home / Self Care | Payer: Self-pay | Source: Ambulatory Visit | Attending: Certified Nurse Midwife | Admitting: Certified Nurse Midwife

## 2016-01-28 ENCOUNTER — Encounter (HOSPITAL_COMMUNITY): Payer: Self-pay

## 2016-01-28 DIAGNOSIS — Z3480 Encounter for supervision of other normal pregnancy, unspecified trimester: Secondary | ICD-10-CM

## 2016-01-28 DIAGNOSIS — Z363 Encounter for antenatal screening for malformations: Secondary | ICD-10-CM | POA: Insufficient documentation

## 2016-01-28 DIAGNOSIS — O09899 Supervision of other high risk pregnancies, unspecified trimester: Secondary | ICD-10-CM

## 2016-01-28 DIAGNOSIS — R7989 Other specified abnormal findings of blood chemistry: Secondary | ICD-10-CM

## 2016-01-28 DIAGNOSIS — O358XX0 Maternal care for other (suspected) fetal abnormality and damage, not applicable or unspecified: Secondary | ICD-10-CM | POA: Insufficient documentation

## 2016-01-28 DIAGNOSIS — Z3A21 21 weeks gestation of pregnancy: Secondary | ICD-10-CM | POA: Insufficient documentation

## 2016-01-28 DIAGNOSIS — Z283 Underimmunization status: Secondary | ICD-10-CM

## 2016-01-28 HISTORY — DX: Other specified health status: Z78.9

## 2016-02-02 ENCOUNTER — Encounter: Payer: Self-pay | Admitting: Certified Nurse Midwife

## 2016-02-06 ENCOUNTER — Other Ambulatory Visit: Payer: Self-pay

## 2016-02-06 ENCOUNTER — Telehealth: Payer: Self-pay | Admitting: *Deleted

## 2016-02-06 NOTE — Telephone Encounter (Signed)
Pt husband called to office inquiring about next appt. Spoke with pt husband.  He states that [facility] after last u/s.  He would like to know what plan is for pt to be seen, do they come here or continue to come here. Pt husband states that pt has procedure scheduled for Monday with [facility]. Reviewed with Dr Clearance CootsHarper, he says that [facility] should give pt a plan when they have appt next.  Pt husband made aware that [facility] should have plan of care for pt. Advised that they should ask for plan if one is not given.  Advised that they should have records sent to Dr Clearance CootsHarper as well.  Pt husbands states understanding.

## 2016-02-09 ENCOUNTER — Telehealth (HOSPITAL_COMMUNITY): Payer: Self-pay | Admitting: MS"

## 2016-02-09 NOTE — Telephone Encounter (Signed)
Called Bonnie King and spoke with her partner, Bonnie King, to discuss her prenatal cell free DNA test results. [address], #[id number] assisted with interpretation.  [name] had Panorama testing through [facility].  Testing was offered because of abnormal ultrasound findings.   The patient was identified by name and DOB.  We reviewed that these are within normal limits, showing a less than 1 in 10,000 risk for trisomies 21, 18 and 13, and monosomy X (Turner syndrome).  In addition, the risk for triploidy and sex chromosome trisomies (47,XXX and 47,XXY) was also low risk.  This testing identifies > 99% of pregnancies with trisomy 3421, trisomy 9813, sex chromosome trisomies (47,XXX and 47,XXY), and triploidy. The detection rate for trisomy 18 is 96%.  The detection rate for monosomy X is ~92%.  The false positive rate is <0.1% for all conditions. Testing was also consistent with female fetal sex.  The patient did wish to know fetal sex.  He understands that this testing does not identify all genetic conditions.  All questions were answered to her satisfaction, they were encouraged to call with additional questions or concerns. Mr. Bonnie King indicated that they [facility]. They plan to discuss with the physician tomorrow the result of the ultrasound and then formulate a plan for the pregnancy, including where to receive the remaining follow-up in the pregnancy.    Bonnie PlowmanKaren Kengo Sturges, MS Patent attorneyCertified Genetic Counselor

## 2016-02-10 ENCOUNTER — Encounter: Payer: Self-pay | Admitting: Obstetrics

## 2016-02-17 ENCOUNTER — Encounter (HOSPITAL_COMMUNITY): Payer: Self-pay

## 2016-02-20 ENCOUNTER — Other Ambulatory Visit (HOSPITAL_COMMUNITY): Payer: Self-pay | Admitting: *Deleted

## 2016-02-23 ENCOUNTER — Ambulatory Visit (HOSPITAL_COMMUNITY)
Admission: RE | Admit: 2016-02-23 | Discharge: 2016-02-23 | Disposition: A | Discharge status: Home / Self Care | Payer: Self-pay | Source: Ambulatory Visit | Attending: Maternal and Fetal Medicine | Admitting: Maternal and Fetal Medicine

## 2016-02-23 DIAGNOSIS — O359XX0 Maternal care for (suspected) fetal abnormality and damage, unspecified, not applicable or unspecified: Secondary | ICD-10-CM | POA: Insufficient documentation

## 2016-02-23 MED ORDER — BETAMETHASONE SOD PHOS & ACET 6 (3-3) MG/ML IJ SUSP
12.0000 mg | Freq: Once | INTRAMUSCULAR | Status: AC
Start: 2016-02-23 — End: 2016-02-23
  Administered 2016-02-23: 12 mg via INTRAMUSCULAR
  Filled 2016-02-23: qty 2

## 2016-02-24 ENCOUNTER — Ambulatory Visit (HOSPITAL_COMMUNITY)
Admission: RE | Admit: 2016-02-24 | Discharge: 2016-02-24 | Disposition: A | Discharge status: Home / Self Care | Payer: Self-pay | Source: Ambulatory Visit | Attending: Maternal and Fetal Medicine | Admitting: Maternal and Fetal Medicine

## 2016-02-24 DIAGNOSIS — O359XX0 Maternal care for (suspected) fetal abnormality and damage, unspecified, not applicable or unspecified: Secondary | ICD-10-CM | POA: Insufficient documentation

## 2016-02-24 MED ORDER — BETAMETHASONE SOD PHOS & ACET 6 (3-3) MG/ML IJ SUSP
12.0000 mg | Freq: Once | INTRAMUSCULAR | Status: AC
Start: 2016-02-24 — End: 2016-02-24
  Administered 2016-02-24: 12 mg via INTRAMUSCULAR
  Filled 2016-02-24: qty 2

## 2016-10-20 ENCOUNTER — Encounter (HOSPITAL_COMMUNITY): Payer: Self-pay

## 2017-11-12 LAB — GLUCOSE, POCT (MANUAL RESULT ENTRY): POC Glucose: 140 mg/dl — AB (ref 70–99)

## 2020-09-10 ENCOUNTER — Other Ambulatory Visit: Payer: Self-pay | Admitting: *Deleted

## 2020-09-10 ENCOUNTER — Other Ambulatory Visit: Payer: Self-pay

## 2020-09-10 DIAGNOSIS — Z124 Encounter for screening for malignant neoplasm of cervix: Secondary | ICD-10-CM

## 2020-09-10 NOTE — Progress Notes (Signed)
Patient c/o white spot on vaginal labia, itchy, odor x 15 years or more.

## 2020-09-10 NOTE — Progress Notes (Signed)
Patient: Bonnie King           Date of Birth: May 18, 1980           MRN: [id number] Visit Date: 09/10/2020 PCP: Patient, No Pcp Per (Inactive)  Cervical Cancer Screening Do you smoke?: No Have you ever had or been told you have an allergy to latex products?: No Marital status: Married Date of last pap smear: 2-5 yrs ago (12/16/2015 Pap/HPV-negative (Postpartum) [facility])) Date of last menstrual period: 08/22/20 Number of pregnancies: 5 Number of births: 4 Have you ever had any of the following? Hysterectomy: No Tubal ligation (tubes tied): No Abnormal bleeding: No Abnormal pap smear: No Venereal warts: No A sex partner with venereal warts: No A high risk* sex partner: No  Cervical Exam  Abnormal Observations: Thick white to yellowish colored discharge observed in vagina. Recommended to patient to follow-up at the [facility] to have a wet prep and possible STD screening completed.  Recommendations: Last Pap smear was 125/2017 at [facility] and normal with negative HPV. Per patient has no history of an abnormal Pap smear. Let Pap smear is available in Epic. Let patient know that if today's Pap smear is normal and HPV negative that her next Pap smear is due in 5 years. Informed patient that will follow-up with her within the next couple of weeks with results of Pap smear by phone. Let patient know a screening mammogram is recommended at age 24 unless clinically indicated prior. Patient verbalized understanding.   Used Spanish interpreter [employer] from [place].  Patient's History Patient Active Problem List   Diagnosis Date Noted   Oligohydramnios 01/27/2016   Maternal varicella, non-immune 12/25/2015   Low vitamin D level 12/25/2015   Supervision of high risk pregnancy, antepartum 12/16/2015   Past Medical History:  Diagnosis Date   Medical history non-contributory     Family History  Problem Relation  Age of Onset   Diabetes Mother    Heart disease Mother    Cancer Father    Tuberculosis Other     Social History   Occupational History   Not on file  Tobacco Use   Smoking status: Never   Smokeless tobacco: Never  Substance and Sexual Activity   Alcohol use: No   Drug use: No   Sexual activity: Yes    Birth control/protection: None

## 2020-09-12 LAB — CYTOLOGY - PAP
Comment: NEGATIVE
Diagnosis: NEGATIVE
High risk HPV: NEGATIVE

## 2020-09-16 ENCOUNTER — Telehealth: Payer: Self-pay

## 2020-09-16 NOTE — Telephone Encounter (Signed)
Via Erika McReynolds, Spanish Interpreter ([employer]), Patient informed negative Pap/HPV results, next pap due in 5 years. Patient verbalized understanding.  

## 2021-12-23 ENCOUNTER — Other Ambulatory Visit: Payer: Self-pay | Admitting: Obstetrics and Gynecology

## 2021-12-23 DIAGNOSIS — Z1231 Encounter for screening mammogram for malignant neoplasm of breast: Secondary | ICD-10-CM

## 2022-01-13 ENCOUNTER — Encounter: Payer: Self-pay | Admitting: Obstetrics and Gynecology

## 2022-01-13 ENCOUNTER — Ambulatory Visit (INDEPENDENT_AMBULATORY_CARE_PROVIDER_SITE_OTHER): Payer: Self-pay | Admitting: Obstetrics and Gynecology

## 2022-01-13 ENCOUNTER — Other Ambulatory Visit (HOSPITAL_COMMUNITY)
Admission: RE | Admit: 2022-01-13 | Discharge: 2022-01-13 | Disposition: A | Discharge status: Home / Self Care | Payer: Self-pay | Source: Ambulatory Visit | Attending: Obstetrics and Gynecology | Admitting: Obstetrics and Gynecology

## 2022-01-13 VITALS — BP 122/80 | HR 71

## 2022-01-13 DIAGNOSIS — L292 Pruritus vulvae: Secondary | ICD-10-CM | POA: Insufficient documentation

## 2022-01-13 DIAGNOSIS — Z1330 Encounter for screening examination for mental health and behavioral disorders, unspecified: Secondary | ICD-10-CM

## 2022-01-13 MED ORDER — CLOBETASOL PROPIONATE 0.05 % EX OINT: TOPICAL_OINTMENT | CUTANEOUS | 5 refills | Status: AC

## 2022-01-13 MED ORDER — HYDROXYZINE HCL 25 MG PO TABS: 25.0000 mg | ORAL_TABLET | Freq: Every day | ORAL | 2 refills | Status: AC

## 2022-01-13 NOTE — Progress Notes (Signed)
GYNECOLOGY VISIT  Patient name: Bonnie King MRN [id number]  Date of birth: 1980-10-10 Chief Complaint:   Vaginal Itching   History:  Bonnie King is a 42 y.o. [phone] being seen today for tichinga nd bumps for several months. Odor present as well. Has tried OTC metrogel without relief. [name] to [place]other location for them to look at and was sent over her for evalution. They did an exam, STI testing, but they said everything was ok and my be something else. Constatnly feeling the sensation. Yes, previous pisode but not constant. No infection  States got worse after husbanc cheated. Has had STI testing that was all negative. She was concerned about HSV as well an wanted to be check for as well. Lots of itching, including at night.     Past Medical History:  Diagnosis Date   Medical history non-contributory     Past Surgical History:  Procedure Laterality Date   NO PAST SURGERIES      The following portions of the patient's history were reviewed and updated as appropriate: allergies, current medications, past family history, past medical history, past social history, past surgical history and problem list.   Health Maintenance:   Last pap 08/2020. Results were: NILM w/ HRHPV negative. H/O abnormal pap: no Last mammogram: ordered. R   Review of Systems:  Pertinent items are noted in HPI. Comprehensive review of systems was otherwise negative.   Objective:  Physical Exam BP 122/80   Pulse 71   LMP 01/02/2022    Physical Exam Vitals and nursing note reviewed. Exam conducted with a chaperone present.  Constitutional:      Appearance: Normal appearance.  HENT:     Head: Normocephalic and atraumatic.  Cardiovascular:     Rate and Rhythm: Normal rate and regular rhythm.  Pulmonary:     Effort: Pulmonary effort is normal.     Breath sounds: Normal breath sounds.  Genitourinary:    Exam position: Lithotomy position.     Comments: Excoriations  noted Thickened skin superior to clitoral hood with ulcerated lesion, whitish change to skin Excoriations with slight ulceration in lower labia minora bilaterally  Nontender labia Skin:    General: Skin is warm and dry.  Neurological:     General: No focal deficit present.     Mental Status: She is alert.  Psychiatric:        Mood and Affect: Mood normal.        Behavior: Behavior normal.        Thought Content: Thought content normal.        Judgment: Judgment normal.      Media Information  Document Information  Photos    01/13/2022 16:10  Attached To:  Office Visit on 01/13/22 with Darliss Cheney, MD  Source Information  Darliss Cheney, MD  [facility] No results found.     Assessment & Plan:   1. Vulvar pruritus Attempted viral culture or lesions. Discussed vaseline/aquaphor for barrier over cut skin. Trial topical steroid and po hydroxyzine for itching. Given duration, suspect LS and less likely HSV. Vaginitis swab obtained.   - clobetasol ointment (TEMOVATE) 0.05 %; Apply to affected area every night for 4 weeks, then every other day for 4 weeks and then twice a week for 4 weeks or until resolution.  Dispense: 30 g; Refill: 5 - hydrOXYzine (ATARAX) 25 MG tablet; Take 1 tablet (25 mg total) by mouth at bedtime.  Dispense:  30 tablet; Refill: 2 - Cervicovaginal ancillary only( [place]) - Herpes simplex virus culture  Return in 6-8 weeks for follow up   Routine preventative health maintenance measures emphasized.  Darliss Cheney, MD [facility]

## 2022-01-14 ENCOUNTER — Telehealth: Payer: Self-pay

## 2022-01-14 NOTE — Telephone Encounter (Addendum)
-----   Message from Darliss Cheney, MD sent at 01/14/2022 12:02 PM EST ----- Regarding: pt contact Hello,  Please contact the patient and let her know the swab used for the HSV test was not the correct swab. If she would like the viral culture, she can come in for a nurse visit and have that done.  Thank you,  Baird Lyons pt with interpreter Eda. Pt desires HSV swab; will come for nurse visit on 01/18/22.

## 2022-01-15 LAB — CERVICOVAGINAL ANCILLARY ONLY
Bacterial Vaginitis (gardnerella): NEGATIVE
Candida Glabrata: NEGATIVE
Candida Vaginitis: NEGATIVE
Chlamydia: NEGATIVE
Comment: NEGATIVE
Comment: NEGATIVE
Comment: NEGATIVE
Comment: NEGATIVE
Comment: NEGATIVE
Comment: NORMAL
Neisseria Gonorrhea: NEGATIVE
Trichomonas: NEGATIVE

## 2022-01-18 ENCOUNTER — Ambulatory Visit (INDEPENDENT_AMBULATORY_CARE_PROVIDER_SITE_OTHER): Payer: Self-pay | Admitting: Obstetrics and Gynecology

## 2022-01-18 ENCOUNTER — Other Ambulatory Visit: Payer: Self-pay

## 2022-01-18 DIAGNOSIS — L292 Pruritus vulvae: Secondary | ICD-10-CM

## 2022-01-19 NOTE — Progress Notes (Signed)
Here for HSV swab of lesions as incorrect swab used at prior visit. Has been using cream and hydroxyzine with improvement. Some day time sleepiness but far less night time itching.

## 2022-01-20 LAB — HERPES SIMPLEX VIRUS CULTURE

## 2022-01-21 ENCOUNTER — Telehealth: Payer: Self-pay

## 2022-01-21 NOTE — Telephone Encounter (Addendum)
-----   Message from Darliss Cheney, MD sent at 01/20/2022  6:03 PM EST ----- Notify patient that vaginal swabs including herpes swab were negative.   Called pt with Spanish interpreter Eda; VM left stating we are calling with normal results. Will attempt to contact a second time.  Pt returned phone call. Results given with interpreter Eda.

## 2022-02-09 ENCOUNTER — Ambulatory Visit: Payer: Self-pay | Admitting: *Deleted

## 2022-02-09 ENCOUNTER — Ambulatory Visit
Admission: RE | Admit: 2022-02-09 | Discharge: 2022-02-09 | Disposition: A | Discharge status: Home / Self Care | Payer: No Typology Code available for payment source | Source: Ambulatory Visit | Attending: Obstetrics and Gynecology | Admitting: Obstetrics and Gynecology

## 2022-02-09 VITALS — BP 121/84 | Wt 158.0 lb

## 2022-02-09 DIAGNOSIS — Z1231 Encounter for screening mammogram for malignant neoplasm of breast: Secondary | ICD-10-CM

## 2022-02-09 DIAGNOSIS — Z1239 Encounter for other screening for malignant neoplasm of breast: Secondary | ICD-10-CM

## 2022-02-09 NOTE — Patient Instructions (Signed)
Explained breast self awareness with Bonnie King. Patient did not need a Pap smear today due to last Pap smear and HPV typing was 08/13/2020. Let her know [facility] will cover Pap smears and HPV typing every 5 years unless has a history of abnormal Pap smears. [facility] screening [facility] on mobile unit. Appointment scheduled Tuesday, February 09, 2022 at 1530. Patient aware of appointment and will be there. Let patient know the [facility] will follow up with her within the next couple weeks with results of her mammogram by letter or phone. Bonnie King verbalized understanding.  Brinden Kincheloe, Arvil Chaco, RN 4:23 PM

## 2022-02-09 NOTE — Progress Notes (Signed)
Ms. Bonnie King is a 42 y.o. female who presents to [facility] clinic today with no complaints.    Pap Smear: Pap smear not completed today. Last Pap smear was 08/13/2020 at the free cervical cancer screening clinic and was normal with negative HPV. Per patient has history of an abnormal Pap smear. Last Pap smear result is available in Epic.   Physical exam: Breasts Right breast slightly larger than the left breast that per patient is normal for her. No skin abnormalities bilateral breasts. No nipple retraction bilateral breasts. No nipple discharge bilateral breasts. No lymphadenopathy. No lumps palpated bilateral breasts. No complaints of pain or tenderness on exam.  Pelvic/Bimanual Pap is not indicated today per BCCCP guidelines.   Smoking History: Patient has never smoked.   Patient Navigation: Patient education provided. Access to services provided for patient through [place] program. Spanish interpreter [name] from [facility] provided.   Breast and Cervical Cancer Risk Assessment: Patient does not have family history of breast cancer, known genetic mutations, or radiation treatment to the chest before age 33. Patient does not have history of cervical dysplasia, immunocompromised, or DES exposure in-utero.  Risk Scores as of 02/09/2022     [name]           5-year 0.5 %   Lifetime 9.03 %   This patient is Hisp[place]/Latina but has no documented birth country, so the [facility] used data from [place] patients to calculate their risk score. Document a birth country in the Demographics activity for a more accurate score.         Last calculated by [name], CMA on 02/09/2022 at  3:27 PM        A: BCCCP exam without pap smear No complaints.  P: Referred patient to the [facility] for a screening mammogram on mobile unit. Appointment scheduled Tuesday, February 09, 2022 at 1530.  Loletta Parish, RN 02/09/2022 4:23 PM

## 2022-03-02 ENCOUNTER — Encounter: Payer: Self-pay | Admitting: Obstetrics and Gynecology

## 2022-03-02 ENCOUNTER — Ambulatory Visit (INDEPENDENT_AMBULATORY_CARE_PROVIDER_SITE_OTHER): Payer: Self-pay | Admitting: Obstetrics and Gynecology

## 2022-03-02 ENCOUNTER — Other Ambulatory Visit: Payer: Self-pay

## 2022-03-02 VITALS — BP 124/80 | HR 66 | Ht 62.0 in | Wt 168.0 lb

## 2022-03-02 DIAGNOSIS — Z789 Other specified health status: Secondary | ICD-10-CM

## 2022-03-02 DIAGNOSIS — L292 Pruritus vulvae: Secondary | ICD-10-CM

## 2022-03-02 NOTE — Progress Notes (Signed)
    GYNECOLOGY VISIT  Patient name: Bonnie King MRN [id number]  Date of birth: Mar 22, 1980 Chief Complaint:   Follow-up   History:  Bonnie King is a 42 y.o. [phone] being seen today for vulvar pruritus. Has been using hydroxyzine sparingly due to sleepiness. Itching has improved significantly.    Past Medical History:  Diagnosis Date   Medical history non-contributory     Past Surgical History:  Procedure Laterality Date   NO PAST SURGERIES      The following portions of the patient's history were reviewed and updated as appropriate: allergies, current medications, past family history, past medical history, past social history, past surgical history and problem list.     Review of Systems:  Pertinent items are noted in HPI. Comprehensive review of systems was otherwise negative.   Objective:  Physical Exam BP 124/80   Pulse 66   Ht 5' 2"$  (1.575 m)   Wt 168 lb (76.2 kg)   LMP 01/29/2022   BMI 30.73 kg/m    Physical Exam Vitals and nursing note reviewed. Exam conducted with a chaperone present.  Constitutional:      Appearance: Normal appearance.  HENT:     Head: Normocephalic and atraumatic.  Pulmonary:     Effort: Pulmonary effort is normal.     Breath sounds: Normal breath sounds.  Genitourinary:    General: Normal vulva.     Exam position: Lithotomy position.     Vagina: Normal.     Cervix: Normal.     Comments: Decreased erythema Small area of persistent skin changes on left upper labia minora  Skin:    General: Skin is warm and dry.  Neurological:     General: No focal deficit present.     Mental Status: She is alert.  Psychiatric:        Mood and Affect: Mood normal.        Behavior: Behavior normal.        Thought Content: Thought content normal.        Judgment: Judgment normal.         Assessment & Plan:   1. Vulvar pruritus Continue clobetasol ointment with focus on areas of greatest discomfort with prn use of  hydroxyzine. Follow up in 3-4 months.   2. Language barrier Spanish interpreter used for encounter   Routine preventative health maintenance measures emphasized.  Darliss Cheney, MD [facility] [place]
